# Patient Record
Sex: Male | Born: 1995 | Race: Black or African American | Hispanic: No | Marital: Single | Smoking: Former smoker
Health system: Southern US, Community
[De-identification: ages and names within clinical notes are randomized; demographics above are authoritative.]

## PROBLEM LIST (undated history)

## (undated) HISTORY — PX: OTHER SURGICAL HISTORY: SHX169

---

## 2019-01-03 ENCOUNTER — Other Ambulatory Visit: Payer: Self-pay

## 2019-01-03 ENCOUNTER — Emergency Department (HOSPITAL_COMMUNITY)
Admission: EM | Admit: 2019-01-03 | Discharge: 2019-01-03 | Disposition: A | Discharge status: Home / Self Care | Payer: No Typology Code available for payment source | Attending: Emergency Medicine | Admitting: Emergency Medicine

## 2019-01-03 ENCOUNTER — Emergency Department (HOSPITAL_COMMUNITY): Payer: No Typology Code available for payment source

## 2019-01-03 ENCOUNTER — Encounter (HOSPITAL_COMMUNITY): Payer: Self-pay | Admitting: Emergency Medicine

## 2019-01-03 DIAGNOSIS — F17210 Nicotine dependence, cigarettes, uncomplicated: Secondary | ICD-10-CM | POA: Diagnosis not present

## 2019-01-03 DIAGNOSIS — F1910 Other psychoactive substance abuse, uncomplicated: Secondary | ICD-10-CM | POA: Diagnosis not present

## 2019-01-03 DIAGNOSIS — Y999 Unspecified external cause status: Secondary | ICD-10-CM | POA: Diagnosis not present

## 2019-01-03 DIAGNOSIS — S0181XA Laceration without foreign body of other part of head, initial encounter: Secondary | ICD-10-CM | POA: Insufficient documentation

## 2019-01-03 DIAGNOSIS — W1800XA Striking against unspecified object with subsequent fall, initial encounter: Secondary | ICD-10-CM | POA: Insufficient documentation

## 2019-01-03 DIAGNOSIS — Y9389 Activity, other specified: Secondary | ICD-10-CM | POA: Diagnosis not present

## 2019-01-03 DIAGNOSIS — M542 Cervicalgia: Secondary | ICD-10-CM | POA: Diagnosis not present

## 2019-01-03 DIAGNOSIS — Y929 Unspecified place or not applicable: Secondary | ICD-10-CM | POA: Diagnosis not present

## 2019-01-03 DIAGNOSIS — S0990XA Unspecified injury of head, initial encounter: Secondary | ICD-10-CM

## 2019-01-03 DIAGNOSIS — R55 Syncope and collapse: Secondary | ICD-10-CM | POA: Diagnosis present

## 2019-01-03 LAB — BASIC METABOLIC PANEL
Anion gap: 11 (ref 5–15)
BUN: 16 mg/dL (ref 6–20)
CO2: 24 mmol/L (ref 22–32)
Calcium: 9.9 mg/dL (ref 8.9–10.3)
Chloride: 103 mmol/L (ref 98–111)
Creatinine, Ser: 1.32 mg/dL — ABNORMAL HIGH (ref 0.61–1.24)
GFR calc Af Amer: >60 mL/min (ref >60)
GFR calc non Af Amer: >60 mL/min (ref >60)
Glucose, Bld: 117 mg/dL — ABNORMAL HIGH (ref 70–99)
Potassium: 3.7 mmol/L (ref 3.5–5.1)
Sodium: 138 mmol/L (ref 135–145)

## 2019-01-03 LAB — CBC
HCT: 45.3 % (ref 39.0–52.0)
Hemoglobin: 15.1 g/dL (ref 13.0–17.0)
MCH: 27 pg (ref 26.0–34.0)
MCHC: 33.3 g/dL (ref 30.0–36.0)
MCV: 80.9 fL (ref 80.0–100.0)
Platelets: 140 10*3/uL — ABNORMAL LOW (ref 150–400)
RBC: 5.6 MIL/uL (ref 4.22–5.81)
RDW: 11.9 % (ref 11.5–15.5)
WBC: 6.7 10*3/uL (ref 4.0–10.5)
nRBC: 0 % (ref 0.0–0.2)

## 2019-01-03 LAB — CBG MONITORING, ED: Glucose-Capillary: 110 mg/dL — ABNORMAL HIGH (ref 70–99)

## 2019-01-03 MED ORDER — LORAZEPAM 2 MG/ML IJ SOLN
1.0000 mg | Freq: Once | INTRAMUSCULAR | Status: DC
  Filled 2019-01-03: qty 1

## 2019-01-03 MED ORDER — LACTATED RINGERS IV BOLUS
1000.0000 mL | Freq: Once | INTRAVENOUS | Status: AC
  Administered 2019-01-03: 1000 mL via INTRAVENOUS

## 2019-01-03 NOTE — ED Notes (Signed)
Patient transported to CT 

## 2019-01-03 NOTE — ED Provider Notes (Signed)
[facility] EMERGENCY DEPARTMENT Provider Note   CSN: [id number] Arrival date & time: 01/03/19  0055     History   Chief Complaint Chief Complaint  Patient presents with  . Loss of Consciousness    HPI Randall Lee is a 23 y.o. male.     Patient presents to the emergency department with a chief complaint of head injury.  He states that he was using shrooms, alcohol, and marijuana tonight.  States that he passed out and hit his head.  He complains of mild neck pain.  Denies any numbness, weakness, or tingling.  Denies any slurred speech or vision changes.  Denies any seizures.  Denies any treatments prior to arrival.  Last tetanus shot was within 5 years.    The history is provided by the patient. No language interpreter was used.    History reviewed. No pertinent past medical history.  There are no active problems to display for this patient.   History reviewed. No pertinent surgical history.      Home Medications    Prior to Admission medications   Not on File    Family History History reviewed. No pertinent family history.  Social History Social History   Tobacco Use  . Smoking status: Current Every Day Smoker  . Smokeless tobacco: Never Used  Substance Use Topics  . Alcohol use: Yes  . Drug use: Yes    Types: Marijuana     Allergies   Patient has no allergy information on record.   Review of Systems Review of Systems  All other systems reviewed and are negative.    Physical Exam Updated Vital Signs BP 140/74   Pulse 61   Temp 99.5 F (37.5 C) (Oral)   Resp 17   Ht 6\' 2"  (1.88 m)   Wt 77.1 kg   SpO2 98%   BMI 21.83 kg/m   Physical Exam Vitals signs and nursing note reviewed.  Constitutional:      Appearance: He is well-developed.  HENT:     Head: Normocephalic.     Comments: 2 cm laceration to right eyebrow Eyes:     Conjunctiva/sclera: Conjunctivae normal.  Neck:     Musculoskeletal: Neck supple. No  muscular tenderness.  Cardiovascular:     Rate and Rhythm: Normal rate and regular rhythm.     Heart sounds: No murmur.  Pulmonary:     Effort: Pulmonary effort is normal. No respiratory distress.     Breath sounds: Normal breath sounds.  Abdominal:     Palpations: Abdomen is soft.     Tenderness: There is no abdominal tenderness.  Musculoskeletal: Normal range of motion.  Skin:    General: Skin is warm and dry.  Neurological:     Mental Status: He is alert.  Psychiatric:        Mood and Affect: Mood normal.        Behavior: Behavior normal.      ED Treatments / Results  Labs (all labs ordered are listed, but only abnormal results are displayed) Labs Reviewed  BASIC METABOLIC PANEL - Abnormal; Notable for the following components:      Result Value   Glucose, Bld 117 (*)    Creatinine, Ser 1.32 (*)    All other components within normal limits  CBC - Abnormal; Notable for the following components:   Platelets 140 (*)    All other components within normal limits  CBG MONITORING, ED - Abnormal; Notable for the following components:  Glucose-Capillary 110 (*)    All other components within normal limits    EKG EKG Interpretation  Date/Time:  Monday January 03 2019 00:57:00 EST Ventricular Rate:  69 PR Interval:    QRS Duration: 94 QT Interval:  360 QTC Calculation: 386 R Axis:   83 Text Interpretation: Sinus rhythm LVH by voltage ST elev, probable normal early repol pattern No old tracing to compare Confirmed by Merrily Pew [id number]) on 01/03/2019 12:58:06 AM   Radiology Ct Head Wo Contrast  Result Date: 01/03/2019 CLINICAL DATA:  Near syncopal episode EXAM: CT HEAD WITHOUT CONTRAST TECHNIQUE: Contiguous axial images were obtained from the base of the skull through the vertex without intravenous contrast. COMPARISON:  None. FINDINGS: Brain: No evidence of acute territorial infarction, hemorrhage, hydrocephalus,extra-axial collection or mass lesion/mass effect.  Normal gray-white differentiation. Ventricles are normal in size and contour. Vascular: No hyperdense vessel or unexpected calcification. Skull: The skull is intact. No fracture or focal lesion identified. Sinuses/Orbits: The visualized paranasal sinuses and mastoid air cells are clear. The orbits and globes intact. Other: Soft tissue swelling seen over the right forehead. Cervical spine: Alignment: Physiologic Skull base and vertebrae: Visualized skull base is intact. No atlanto-occipital dissociation. The vertebral body heights are well maintained. No fracture or pathologic osseous lesion seen. Soft tissues and spinal canal: The visualized paraspinal soft tissues are unremarkable. No prevertebral soft tissue swelling is seen. The spinal canal is grossly unremarkable, no large epidural collection or significant canal narrowing. Disc levels:  No significant canal or neural foraminal narrowing. Upper chest: The lung apices are clear. Thoracic inlet is within normal limits. Other: None IMPRESSION: No acute intracranial abnormality. Soft tissue swelling seen over the right forehead. No acute fracture or malalignment of the spine. Electronically Signed   By: Prudencio Pair M.D.   On: 01/03/2019 02:26   Ct Cervical Spine Wo Contrast  Result Date: 01/03/2019 CLINICAL DATA:  Near syncopal episode EXAM: CT HEAD WITHOUT CONTRAST TECHNIQUE: Contiguous axial images were obtained from the base of the skull through the vertex without intravenous contrast. COMPARISON:  None. FINDINGS: Brain: No evidence of acute territorial infarction, hemorrhage, hydrocephalus,extra-axial collection or mass lesion/mass effect. Normal gray-white differentiation. Ventricles are normal in size and contour. Vascular: No hyperdense vessel or unexpected calcification. Skull: The skull is intact. No fracture or focal lesion identified. Sinuses/Orbits: The visualized paranasal sinuses and mastoid air cells are clear. The orbits and globes intact.  Other: Soft tissue swelling seen over the right forehead. Cervical spine: Alignment: Physiologic Skull base and vertebrae: Visualized skull base is intact. No atlanto-occipital dissociation. The vertebral body heights are well maintained. No fracture or pathologic osseous lesion seen. Soft tissues and spinal canal: The visualized paraspinal soft tissues are unremarkable. No prevertebral soft tissue swelling is seen. The spinal canal is grossly unremarkable, no large epidural collection or significant canal narrowing. Disc levels:  No significant canal or neural foraminal narrowing. Upper chest: The lung apices are clear. Thoracic inlet is within normal limits. Other: None IMPRESSION: No acute intracranial abnormality. Soft tissue swelling seen over the right forehead. No acute fracture or malalignment of the spine. Electronically Signed   By: Prudencio Pair M.D.   On: 01/03/2019 02:26    Procedures Procedures (including critical care time) LACERATION REPAIR Performed by: Montine Circle Authorized by: Montine Circle Consent: Verbal consent obtained. Risks and benefits: risks, benefits and alternatives were discussed Consent given by: patient Patient identity confirmed: provided demographic data Prepped and Draped in normal sterile fashion Wound explored  Laceration Location: forehead  Laceration Length: 2cm  No Foreign Bodies seen or palpated  Irrigation method: syringe Amount of cleaning: standard  Skin closure: dermabond  Number of sutures: dermabond  Technique: dermabond  Patient tolerance: Patient tolerated the procedure well with no immediate complications.  Medications Ordered in ED Medications  lactated ringers bolus 1,000 mL (1,000 mLs Intravenous New Bag/Given 01/03/19 0113)     Initial Impression / Assessment and Plan / ED Course  I have reviewed the triage vital signs and the nursing notes.  Pertinent labs & imaging results that were available during my care of the  patient were reviewed by me and considered in my medical decision making (see chart for details).        Patient with reported drug use and alcohol use today.  States that he passed out and hit his head.  He sustained a small laceration.  This was repaired with Dermabond.  CT of his head and neck are negative for any acute intracranial abnormality or C-spine fracture.  He is alert and oriented.  He has no complaints.  Discharged home.  Final Clinical Impressions(s) / ED Diagnoses   Final diagnoses:  Polysubstance abuse [facility]HCC)  Injury of head, initial encounter    ED Discharge Orders    None       Roxy HorsemanBrowning, Adylene Dlugosz, PA-C 01/03/19 0302    Mesner, Barbara CowerJason, MD 01/03/19 [id number]

## 2019-01-03 NOTE — ED Triage Notes (Addendum)
Pt from home BIB [facility] following loss of consciousness lasting 5 minutes. Pt presents to ED A&Ox4, with laceration to right eyebrow after landing head first on the ground.   Pt c/o headache rating 3 out of 10 and neck stiffness. C collar in place  Pt confirms ETOH, TCH, and "shrooms."

## 2019-01-03 NOTE — ED Notes (Signed)
Discharge instructions discussed with pt. Pt verbalized understanding with no questions at this time. Pt to go home with friend 

## 2019-05-12 ENCOUNTER — Ambulatory Visit: Payer: Self-pay | Attending: Internal Medicine

## 2019-05-14 ENCOUNTER — Ambulatory Visit: Payer: No Typology Code available for payment source

## 2019-05-14 ENCOUNTER — Ambulatory Visit: Payer: 59 | Attending: Internal Medicine

## 2019-05-14 DIAGNOSIS — Z23 Encounter for immunization: Secondary | ICD-10-CM

## 2019-05-14 NOTE — Progress Notes (Signed)
   Covid-19 Vaccination Clinic  Name:  Antoinette Haskett    MRN: [id number] DOB: [birth year]  05/14/2019  Mr. Calvario was observed post Covid-19 immunization for 15 minutes without incident. He was provided with Vaccine Information Sheet and instruction to access the V-Safe system.   Mr. Janora Norlander was instructed to call 911 with any severe reactions post vaccine: Marland Kitchen Difficulty breathing  . Swelling of face and throat  . A fast heartbeat  . A bad rash all over body  . Dizziness and weakness   Immunizations Administered    Name Date Dose VIS Date Route   Pfizer COVID-19 Vaccine 05/14/2019  1:48 PM 0.3 mL 01/14/2019 Intramuscular   Manufacturer: [employer], [employer]   Lot: [id number]   NDC: [address]-5

## 2019-06-06 ENCOUNTER — Ambulatory Visit: Payer: 59 | Attending: Internal Medicine

## 2019-06-06 DIAGNOSIS — Z23 Encounter for immunization: Secondary | ICD-10-CM

## 2019-06-06 NOTE — Progress Notes (Signed)
   Covid-19 Vaccination Clinic  Name:  Randall Lee    MRN: [id number] DOB: [birth year]  06/06/2019  Mr. Randall Lee was observed post Covid-19 immunization for 15 minutes without incident. He was provided with Vaccine Information Sheet and instruction to access the V-Safe system.   Mr. Randall Lee was instructed to call 911 with any severe reactions post vaccine: Marland Kitchen Difficulty breathing  . Swelling of face and throat  . A fast heartbeat  . A bad rash all over body  . Dizziness and weakness   Immunizations Administered    Name Date Dose VIS Date Route   Pfizer COVID-19 Vaccine 06/06/2019  1:14 PM 0.3 mL 03/30/2018 Intramuscular   Manufacturer: [employer], [employer]   Lot: [id number]   NDC: [address]-3

## 2020-07-18 IMAGING — CT CT HEAD W/O CM
4 series · 16 of 47 positions shown, 18 images · non-contrast
Comparison: None.

CLINICAL DATA: Near syncopal episode

EXAM:
CT HEAD WITHOUT CONTRAST
TECHNIQUE: Contiguous axial images were obtained from the base of the skull
through the vertex without intravenous contrast.

[Series 3: head bone · axial · 0.47mm/px · z∈[+1373,+1409]mm · 3 of 93 slices shown]
[im 10/93  bone]
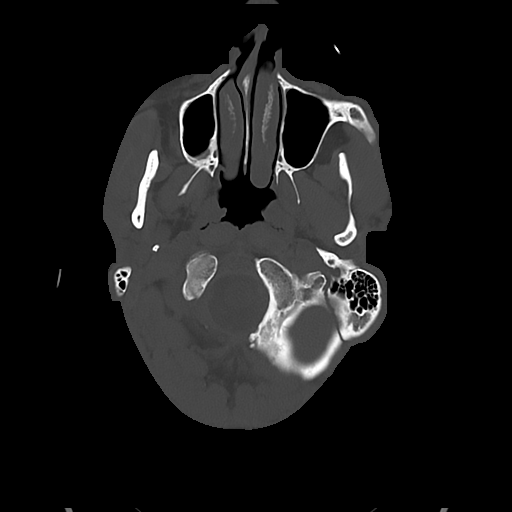
[im 19/93  bone]
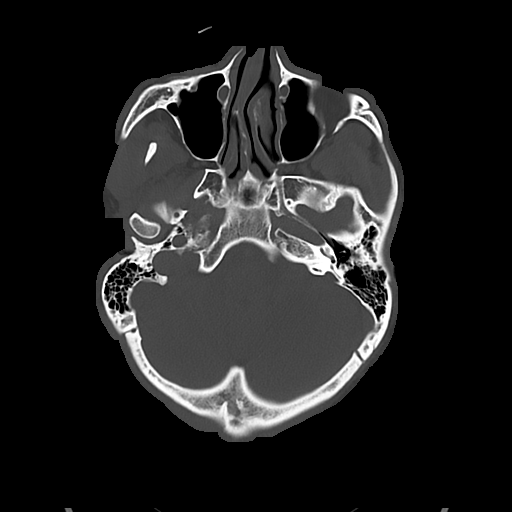
[im 28/93  bone]
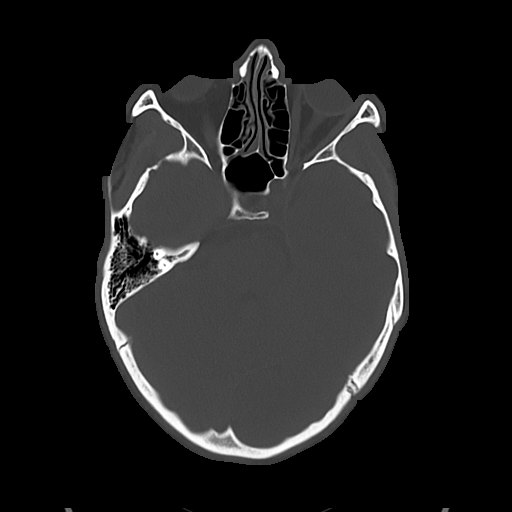

[Series 4: head wo · axial · 0.47mm/px · z∈[+1375,+1510]mm · 7 of 37 slices shown, 9 images]
[im 5/37  brain]
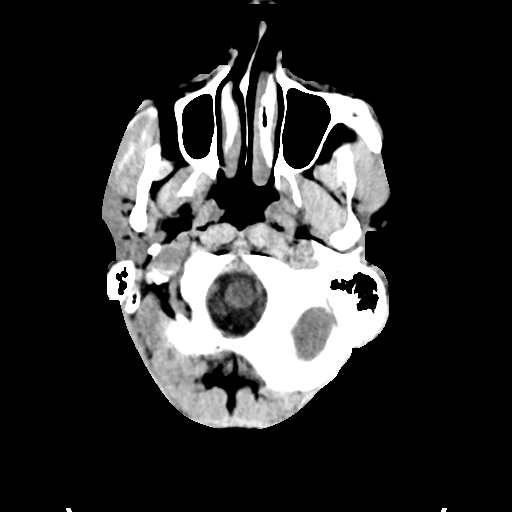
[im 5/37  bone]
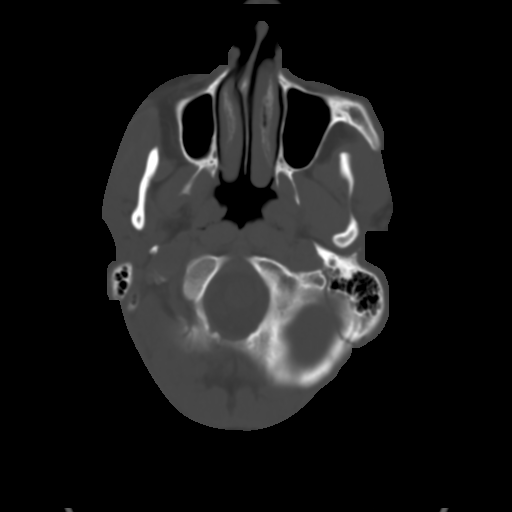
[im 10/37  brain]
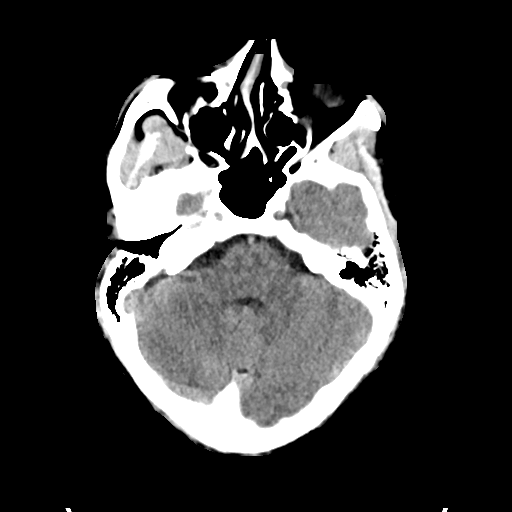
[im 14/37  brain]
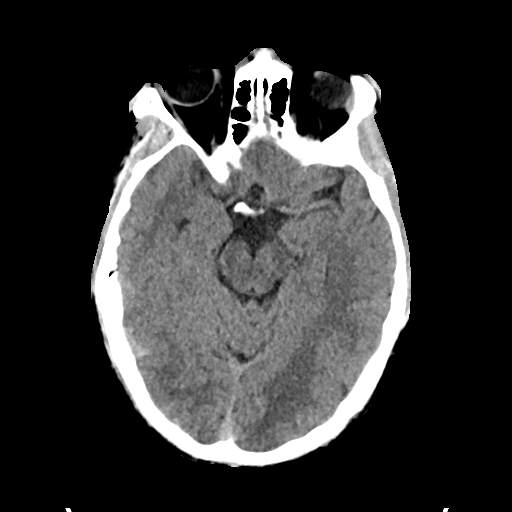
[im 19/37  brain]
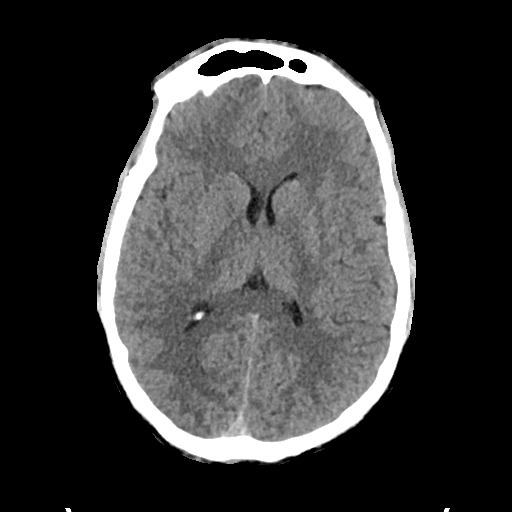
[im 23/37  brain]
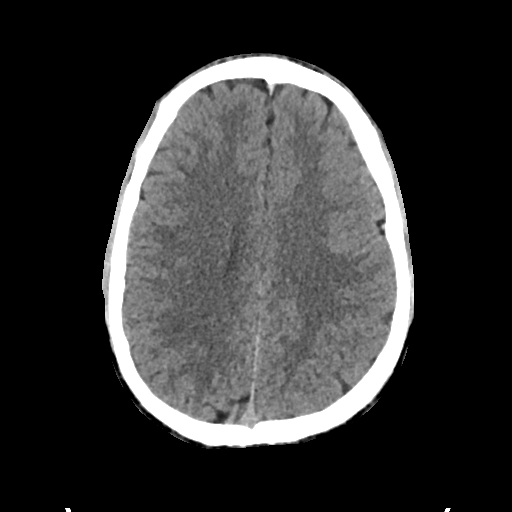
[im 23/37  bone]
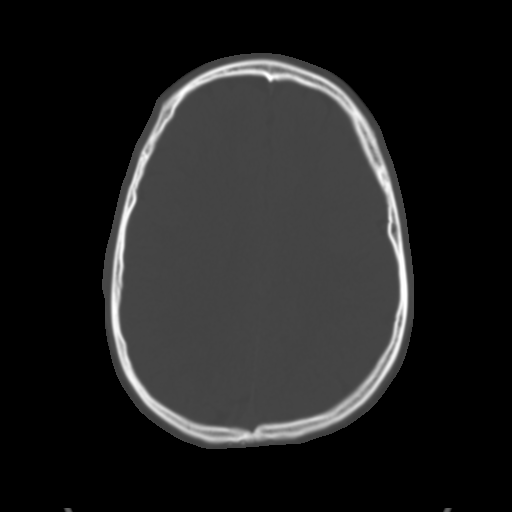
[im 28/37  brain]
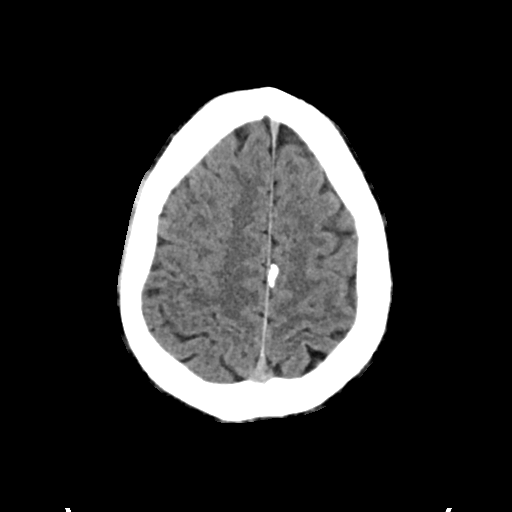
[im 32/37  brain]
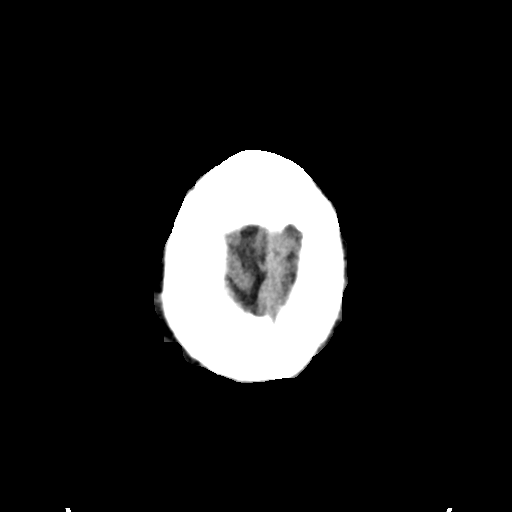

[Series 5: cor soft · coronal · 0.35mm/px · 3 of 75 slices shown]
[im 25/75  brain]
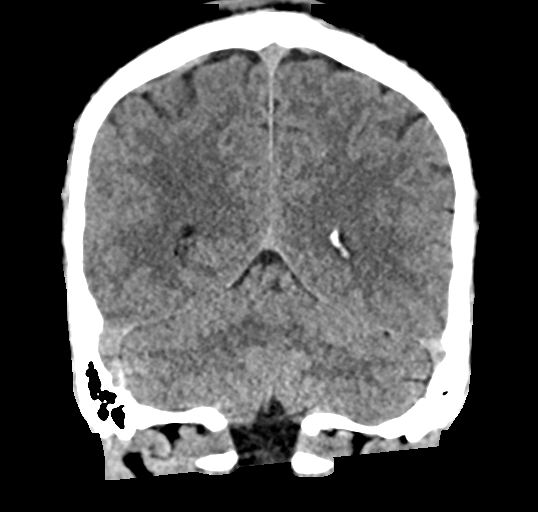
[im 33/75  brain]
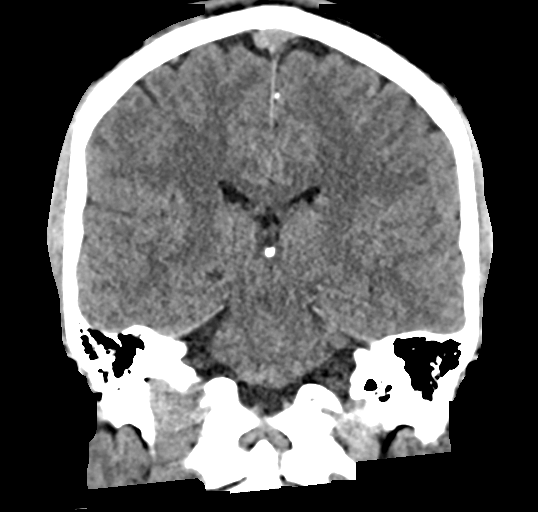
[im 42/75  brain]
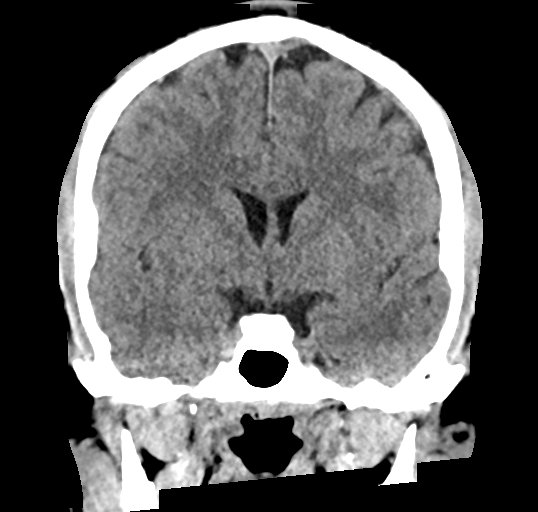

[Series 6: sag soft · sagittal · 0.36mm/px · 3 of 62 slices shown]
[im 21/62  brain]
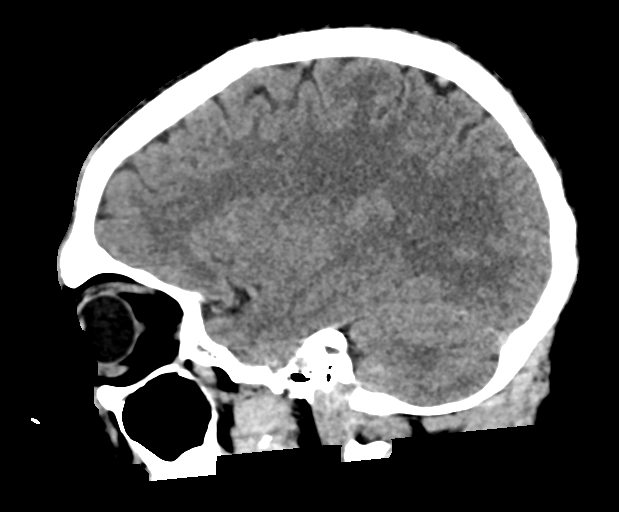
[im 31/62  brain]
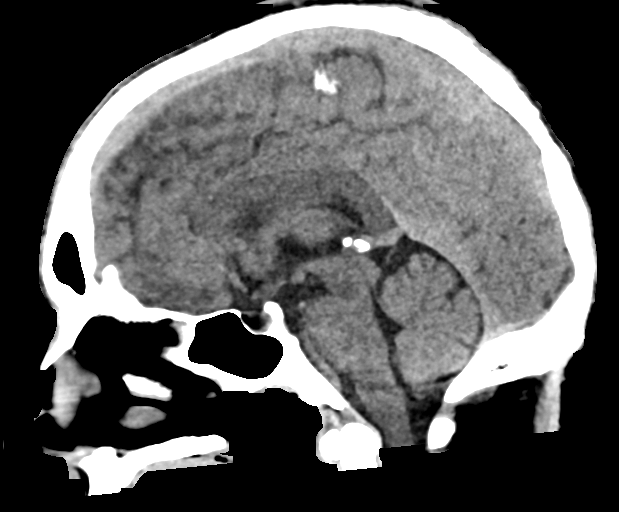
[im 41/62  brain]
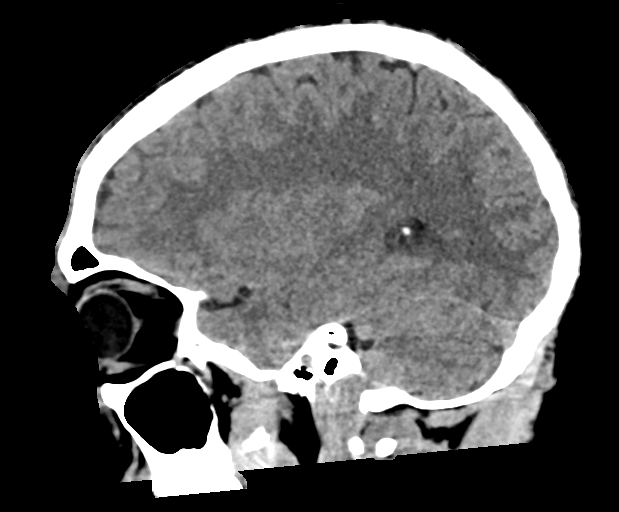

[16 of 47 positions shown; findings below may reference images not displayed]

FINDINGS: Brain: No evidence of acute territorial infarction, hemorrhage,
hydrocephalus,extra-axial collection or mass lesion/mass effect.
Normal gray-white differentiation. Ventricles are normal in size and
contour.

Vascular: No hyperdense vessel or unexpected calcification.

Skull: The skull is intact. No fracture or focal lesion identified.

Sinuses/Orbits: The visualized paranasal sinuses and mastoid air
cells are clear. The orbits and globes intact.

Other: Soft tissue swelling seen over the right forehead.

Cervical spine:

Alignment: Physiologic

Skull base and vertebrae: Visualized skull base is intact. No
atlanto-occipital dissociation. The vertebral body heights are well
maintained. No fracture or pathologic osseous lesion seen.

Soft tissues and spinal canal: The visualized paraspinal soft
tissues are unremarkable. No prevertebral soft tissue swelling is
seen. The spinal canal is grossly unremarkable, no large epidural
collection or significant canal narrowing.

Disc levels:  No significant canal or neural foraminal narrowing.

Upper chest: The lung apices are clear. Thoracic inlet is within
normal limits.

Other: None
IMPRESSION: No acute intracranial abnormality.

Soft tissue swelling seen over the right forehead.

No acute fracture or malalignment of the spine.

## 2023-06-23 ENCOUNTER — Encounter: Payer: Self-pay | Admitting: Cardiology

## 2023-06-25 ENCOUNTER — Ambulatory Visit

## 2023-06-25 ENCOUNTER — Encounter: Payer: Self-pay | Admitting: Cardiology

## 2023-06-25 ENCOUNTER — Ambulatory Visit: Attending: Cardiology | Admitting: Cardiology

## 2023-06-25 VITALS — BP 124/66 | HR 76 | Ht 74.0 in | Wt 173.8 lb

## 2023-06-25 DIAGNOSIS — R011 Cardiac murmur, unspecified: Secondary | ICD-10-CM | POA: Diagnosis present

## 2023-06-25 DIAGNOSIS — R002 Palpitations: Secondary | ICD-10-CM | POA: Diagnosis present

## 2023-06-25 DIAGNOSIS — R001 Bradycardia, unspecified: Secondary | ICD-10-CM | POA: Diagnosis present

## 2023-06-25 DIAGNOSIS — I517 Cardiomegaly: Secondary | ICD-10-CM | POA: Insufficient documentation

## 2023-06-25 DIAGNOSIS — Z7689 Persons encountering health services in other specified circumstances: Secondary | ICD-10-CM | POA: Diagnosis present

## 2023-06-25 NOTE — Progress Notes (Signed)
 Cardiology Consultation:    Date:  06/25/2023   ID:  Moustapha Tooker, DOB 1996/01/09, MRN [id number]  PCP:  Dianah Fort, PA  Cardiologist:  Ralene Burger, MD   Referring MD: Dianah Fort, [place]   Chief Complaint  Patient presents with   heart eval    History of Present Illness:    Noam Karaffa is a 28 y.o. male who is being seen today for the evaluation of heart murmur at the request of Dianah Fort, [place].  Past medical history significant for heart murmur and that he was noted to have when he was a child.  After that he did have some echocardiogram done he was told not anything critical but he need to follow-up from time to time.  He have not seen any cardiologist recently despite he was referred back to me.  He is doing overall very well.  He exercised on the regular basis right back in the matter-of-fact just recently participated in some bike race and he became second.  Denies have any chest pain tightness squeezing pressure burning chest.  Asymptomatic.  No dizziness no passing out.  He described only 1 episode of near syncope that happened years ago when he was trying to push heart rate running after he finished running in the recovery phase he became lightheaded and almost passed out he lay down on the ground he was fine.  He also had abnormal EKG showing sinus bradycardia with a rate of 46.  No dizziness no passing out no syncope.  He does not smoke cigarettes.  He does not have family history of premature coronary artery disease but apparently there is some family history of heart murmur  History reviewed. No pertinent past medical history.  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     Allergies:   Patient has no known allergies.   Social History   Socioeconomic  History   Marital status: Single    Spouse name: Not on file   Number of children: Not on file   Years of education: Not on file   Highest education level: Not on file  Occupational History   Not on file  Tobacco Use   Smoking status: Every Day   Smokeless tobacco: Never  Substance and Sexual Activity   Alcohol use: Yes   Drug use: Yes    Types: Marijuana   Sexual activity: Yes  Other Topics Concern   Not on file  Social History Narrative   ** Merged History Encounter **       Social Drivers of Health   Financial Resource Strain: Not on file  Food Insecurity: Not on file  Transportation Needs: Not on file  Physical Activity: Not on file  Stress: Not on file  Social Connections: Not on file     Family History: The patient's family history includes Hypertension in his mother. ROS:   Please see the history of present illness.    All 14 point review of systems negative except as described per history of present illness.  EKGs/Labs/Other Studies Reviewed:    The following studies were reviewed today:   EKG:  EKG Interpretation Date/Time:  Thursday Jun 25 2023 10:09:20 EDT Ventricular Rate:  46 PR Interval:  160 QRS Duration:  92 QT Interval:  432  QTC Calculation: 378 R Axis:   81  Text Interpretation: Sinus bradycardia with sinus arrhythmia Left ventricular hypertrophy Early repolarization Abnormal ECG When compared with ECG of 03-Jan-2019 00:57, PREVIOUS ECG IS PRESENT Confirmed by Ralene Burger [phone]) on 06/25/2023 10:32:30 AM    Recent Labs: No results found for requested labs within last 365 days.  Recent Lipid Panel No results found for: "CHOL", "TRIG", "HDL", "CHOLHDL", "VLDL", "LDLCALC", "LDLDIRECT"  Physical Exam:    VS:  BP 124/66 (BP Location: Left Arm, Patient Position: Sitting)   Pulse 76   Ht 6\' 2"  (1.88 m)   Wt 173 lb 12.8 oz (78.8 kg)   SpO2 99%   BMI 22.31 kg/m     Wt Readings from Last 3 Encounters:  06/25/23 173 lb 12.8 oz (78.8  kg)  01/03/19 170 lb (77.1 kg)     GEN:  Well nourished, well developed in no acute distress HEENT: Normal NECK: No JVD; No carotid bruits LYMPHATICS: No lymphadenopathy CARDIAC: RRR, late systolic murmur best heard at the left border of the sternum grade 1/6 to 2/6 no rubs, no gallops RESPIRATORY:  Clear to auscultation without rales, wheezing or rhonchi  ABDOMEN: Soft, non-tender, non-distended MUSCULOSKELETAL:  No edema; No deformity  SKIN: Warm and dry NEUROLOGIC:  Alert and oriented x 3 PSYCHIATRIC:  Normal affect   ASSESSMENT:    1. Encounter to establish care   2. Palpitations   3. Heart murmur   4. Sinus bradycardia    PLAN:    In order of problems listed above:  Heart murmur.  Will schedule him to have an echocardiogram done to reassess valves.  I honestly do not anticipate to have anything critical especially in view of the fact that he is very active and he can run or ride a bike with no difficulties. Sinus bradycardia.  Completely asymptomatic will put monitor for 1 week to make sure his sinus node have normal function anticipate fact that his heart rate is slow related to good vagal tone. Left ventricle hypertrophy again will do echocardiogram to look at thickness of the wall could be related to the fact that he is athletic also he is relatively skinny   Medication Adjustments/Labs and Tests Ordered: Current medicines are reviewed at length with the patient today.  Concerns regarding medicines are outlined above.  Orders Placed This Encounter  Procedures   LONG TERM MONITOR (3-14 DAYS)   EKG 12-Lead   ECHOCARDIOGRAM COMPLETE   No orders of the defined types were placed in this encounter.   Signed, Manfred Seed, MD, [place]. 06/25/2023 10:33 AM    [facility]

## 2023-06-25 NOTE — Patient Instructions (Signed)
 Medication Instructions:  Your physician recommends that you continue on your current medications as directed. Please refer to the Current Medication list given to you today.  *If you need a refill on your cardiac medications before your next appointment, please call your pharmacy*   Lab Work: None Ordered If you have labs (blood work) drawn today and your tests are completely normal, you will receive your results only by: MyChart Message (if you have MyChart) OR A paper copy in the mail If you have any lab test that is abnormal or we need to change your treatment, we will call you to review the results.   Testing/Procedures: Your physician has requested that you have an echocardiogram. Echocardiography is a painless test that uses sound waves to create images of your heart. It provides your doctor with information about the size and shape of your heart and how well your heart's chambers and valves are working. This procedure takes approximately one hour. There are no restrictions for this procedure. Please do NOT wear cologne, perfume, aftershave, or lotions (deodorant is allowed). Please arrive 15 minutes prior to your appointment time.  Please note: We ask at that you not bring children with you during ultrasound (echo/ vascular) testing. Due to room size and safety concerns, children are not allowed in the ultrasound rooms during exams. Our front office staff cannot provide observation of children in our lobby area while testing is being conducted. An adult accompanying a patient to their appointment will only be allowed in the ultrasound room at the discretion of the ultrasound technician under special circumstances. We apologize for any inconvenience.    WHY IS MY DOCTOR PRESCRIBING ZIO? The Zio system is proven and trusted by physicians to detect and diagnose irregular heart rhythms -- and has been prescribed to hundreds of thousands of patients.  The FDA has cleared the Zio system to  monitor for many different kinds of irregular heart rhythms. In a study, physicians were able to reach a diagnosis 90% of the time with the Zio system1.  You can wear the Zio monitor -- a small, discreet, comfortable patch -- during your normal day-to-day activity, including while you sleep, shower, and exercise, while it records every single heartbeat for analysis.  1Barrett, P., et al. Comparison of 24 Hour Holter Monitoring Versus 14 Day Novel Adhesive Patch Electrocardiographic Monitoring. American Journal of Medicine, 2014.  ZIO VS. HOLTER MONITORING The Zio monitor can be comfortably worn for up to 14 days. Holter monitors can be worn for 24 to 48 hours, limiting the time to record any irregular heart rhythms you may have. Zio is able to capture data for the 51% of patients who have their first symptom-triggered arrhythmia after 48 hours.1  LIVE WITHOUT RESTRICTIONS The Zio ambulatory cardiac monitor is a small, unobtrusive, and water-resistant patch--you might even forget you're wearing it. The Zio monitor records and stores every beat of your heart, whether you're sleeping, working out, or showering.     Follow-Up: At [facility], you and your health needs are our priority.  As part of our continuing mission to provide you with exceptional heart care, we have created designated Provider Care Teams.  These Care Teams include your primary Cardiologist (physician) and Advanced Practice Providers (APPs -  Physician Assistants and Nurse Practitioners) who all work together to provide you with the care you need, when you need it.  We recommend signing up for the patient portal called "MyChart".  Sign up information is provided on this  After Visit Summary.  MyChart is used to connect with patients for Virtual Visits (Telemedicine).  Patients are able to view lab/test results, encounter notes, upcoming appointments, etc.  Non-urgent messages can be sent to your provider as well.   To learn more  about what you can do with MyChart, go to [email or web address].    Your next appointment:   3 month(s)  The format for your next appointment:   In Person  Provider:   Gypsy Balsam, MD    Other Instructions NA

## 2023-07-08 ENCOUNTER — Telehealth: Payer: Self-pay | Admitting: Cardiology

## 2023-07-08 ENCOUNTER — Other Ambulatory Visit: Payer: Self-pay

## 2023-07-08 DIAGNOSIS — R001 Bradycardia, unspecified: Secondary | ICD-10-CM

## 2023-07-08 NOTE — Telephone Encounter (Signed)
 Called the patient and informed him that his heart rate had dropped into the 30's based on the results of his heart monitor. I had spoken to Dr. Gordan Latina regarding this and he had referred him to see Dr. Lawana Pray in EP. Patient verbalized understanding and had no further questions at this time. A referral was placed via Epic for the patient to have an appointment with Dr. Lawana Pray.

## 2023-07-08 NOTE — Telephone Encounter (Signed)
 Olivia/I-Rhythm abn zio results

## 2023-07-08 NOTE — Telephone Encounter (Signed)
 Called Olivia back at [facility] and spoke to [place]. She reported that the patient had 3 episodes of symptomatic bradycardia:  39 bpm which lasted for 30 seconds and occurred on 5/22 at 11:35 am  2.   35 bpm which lasted for 30 seconds and occurred on 5/24 at 8: 47 pm  3.   32 bpm which lasted for 30 seconds and occurred on 5/26 at 12:29 am  The Zio heart monitor report is in Epic. Please advise.

## 2023-07-20 ENCOUNTER — Ambulatory Visit: Payer: Self-pay | Admitting: Cardiology

## 2023-07-20 DIAGNOSIS — R002 Palpitations: Secondary | ICD-10-CM

## 2023-08-03 ENCOUNTER — Ambulatory Visit (HOSPITAL_COMMUNITY)
Admission: RE | Admit: 2023-08-03 | Discharge: 2023-08-03 | Disposition: A | Discharge status: Home / Self Care | Source: Ambulatory Visit | Attending: Cardiovascular Disease | Admitting: Cardiovascular Disease

## 2023-08-03 DIAGNOSIS — R011 Cardiac murmur, unspecified: Secondary | ICD-10-CM | POA: Diagnosis present

## 2023-08-03 LAB — ECHOCARDIOGRAM COMPLETE
Area-P 1/2: 2.79 cm2
S' Lateral: 3 cm

## 2023-09-04 ENCOUNTER — Telehealth: Payer: Self-pay | Admitting: Cardiology

## 2023-09-04 NOTE — Telephone Encounter (Signed)
Pt returning nurses call regarding results. Please advise 

## 2023-09-04 NOTE — Telephone Encounter (Signed)
Patient informed of the results of his echo. ?

## 2023-09-06 ENCOUNTER — Emergency Department (HOSPITAL_COMMUNITY)

## 2023-09-06 ENCOUNTER — Emergency Department (HOSPITAL_COMMUNITY)
Admission: EM | Admit: 2023-09-06 | Discharge: 2023-09-06 | Disposition: A | Discharge status: Home / Self Care | Attending: Student in an Organized Health Care Education/Training Program | Admitting: Student in an Organized Health Care Education/Training Program

## 2023-09-06 ENCOUNTER — Encounter (HOSPITAL_COMMUNITY): Payer: Self-pay

## 2023-09-06 ENCOUNTER — Other Ambulatory Visit: Payer: Self-pay

## 2023-09-06 DIAGNOSIS — S80211A Abrasion, right knee, initial encounter: Secondary | ICD-10-CM | POA: Insufficient documentation

## 2023-09-06 DIAGNOSIS — S0993XA Unspecified injury of face, initial encounter: Secondary | ICD-10-CM

## 2023-09-06 DIAGNOSIS — S025XXA Fracture of tooth (traumatic), initial encounter for closed fracture: Secondary | ICD-10-CM | POA: Insufficient documentation

## 2023-09-06 DIAGNOSIS — S0181XA Laceration without foreign body of other part of head, initial encounter: Secondary | ICD-10-CM | POA: Insufficient documentation

## 2023-09-06 DIAGNOSIS — Y92410 Unspecified street and highway as the place of occurrence of the external cause: Secondary | ICD-10-CM | POA: Diagnosis not present

## 2023-09-06 DIAGNOSIS — S20319A Abrasion of unspecified front wall of thorax, initial encounter: Secondary | ICD-10-CM | POA: Insufficient documentation

## 2023-09-06 DIAGNOSIS — S301XXA Contusion of abdominal wall, initial encounter: Secondary | ICD-10-CM | POA: Diagnosis not present

## 2023-09-06 LAB — I-STAT CHEM 8, ED
BUN: 13 mg/dL (ref 6–20)
Calcium, Ion: 1.25 mmol/L (ref 1.15–1.40)
Chloride: 101 mmol/L (ref 98–111)
Creatinine, Ser: 1.3 mg/dL — ABNORMAL HIGH (ref 0.61–1.24)
Glucose, Bld: 96 mg/dL (ref 70–99)
HCT: 42 % (ref 39.0–52.0)
Hemoglobin: 14.3 g/dL (ref 13.0–17.0)
Potassium: 4.3 mmol/L (ref 3.5–5.1)
Sodium: 140 mmol/L (ref 135–145)
TCO2: 26 mmol/L (ref 22–32)

## 2023-09-06 LAB — CBC
HCT: 42.8 % (ref 39.0–52.0)
Hemoglobin: 13.3 g/dL (ref 13.0–17.0)
MCH: 26.5 pg (ref 26.0–34.0)
MCHC: 31.1 g/dL (ref 30.0–36.0)
MCV: 85.4 fL (ref 80.0–100.0)
Platelets: 119 K/uL — ABNORMAL LOW (ref 150–400)
RBC: 5.01 MIL/uL (ref 4.22–5.81)
RDW: 12.3 % (ref 11.5–15.5)
WBC: 6.6 K/uL (ref 4.0–10.5)
nRBC: 0 % (ref 0.0–0.2)

## 2023-09-06 LAB — COMPREHENSIVE METABOLIC PANEL WITH GFR
ALT: 21 U/L (ref 0–44)
AST: 28 U/L (ref 15–41)
Albumin: 4.3 g/dL (ref 3.5–5.0)
Alkaline Phosphatase: 44 U/L (ref 38–126)
Anion gap: 6 (ref 5–15)
BUN: 11 mg/dL (ref 6–20)
CO2: 27 mmol/L (ref 22–32)
Calcium: 9.7 mg/dL (ref 8.9–10.3)
Chloride: 104 mmol/L (ref 98–111)
Creatinine, Ser: 1.19 mg/dL (ref 0.61–1.24)
GFR, Estimated: >60 mL/min (ref >60)
Glucose, Bld: 98 mg/dL (ref 70–99)
Potassium: 4.2 mmol/L (ref 3.5–5.1)
Sodium: 137 mmol/L (ref 135–145)
Total Bilirubin: 1.1 mg/dL (ref 0.0–1.2)
Total Protein: 7 g/dL (ref 6.5–8.1)

## 2023-09-06 MED ORDER — LIDOCAINE HCL (PF) 1 % IJ SOLN
30.0000 mL | Freq: Once | INTRAMUSCULAR | Status: AC
  Administered 2023-09-06: 30 mL
  Filled 2023-09-06: qty 30

## 2023-09-06 MED ORDER — IOHEXOL 350 MG/ML SOLN
75.0000 mL | Freq: Once | INTRAVENOUS | Status: AC | PRN
  Administered 2023-09-06: 75 mL via INTRAVENOUS

## 2023-09-06 MED ORDER — SULFAMETHOXAZOLE-TRIMETHOPRIM 800-160 MG PO TABS: 1.0000 | ORAL_TABLET | Freq: Two times a day (BID) | ORAL | 0 refills | Status: DC

## 2023-09-06 NOTE — ED Notes (Signed)
 Ed provider awaiting for a dental medication coming from one of our med center ED. Parents getting anxious and upset about the long wait. Parent oriented CN is working on getting the medication available and as soon provider has it he will be on the Room to take care of the pt. Parents continues arguing with ED staff requesting to talk to CN because they are ready to go. CN oriented parents about waiting for meds to come from another location.

## 2023-09-06 NOTE — Discharge Instructions (Signed)
 Please see your primary care provider within the next 7 days to have your sutures removed.  If you do have any concerns regarding your wound healing please see us  here in the emergency department or follow-up with your primary care provider.  Strongly encourage you to follow-up with a dentist regarding the fractures to your tooth, the temporary dental cement that was placed on your tooth will not be permanent, and does need to be replaced with a permanent filling.
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
## 2023-09-13 ENCOUNTER — Ambulatory Visit (HOSPITAL_COMMUNITY)
Admission: RE | Admit: 2023-09-13 | Discharge: 2023-09-13 | Disposition: A | Discharge status: Home / Self Care | Payer: Self-pay | Source: Ambulatory Visit | Attending: Nurse Practitioner | Admitting: Nurse Practitioner

## 2023-09-13 ENCOUNTER — Encounter (HOSPITAL_COMMUNITY): Payer: Self-pay

## 2023-09-13 ENCOUNTER — Ambulatory Visit (HOSPITAL_COMMUNITY): Payer: Self-pay

## 2023-09-13 VITALS — BP 112/64 | HR 48 | Temp 98.8°F | Resp 14

## 2023-09-13 DIAGNOSIS — Z4802 Encounter for removal of sutures: Secondary | ICD-10-CM | POA: Diagnosis not present

## 2023-09-13 DIAGNOSIS — Z5189 Encounter for other specified aftercare: Secondary | ICD-10-CM | POA: Diagnosis not present

## 2023-09-13 NOTE — ED Triage Notes (Signed)
 Patient reports that he received sutures to his left cheek and left eye brow. Patient received these in the [facility] ED. No redness, edema, or drainage.
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
## 2023-09-28 ENCOUNTER — Ambulatory Visit: Attending: Cardiology | Admitting: Cardiology

## 2023-09-28 ENCOUNTER — Encounter: Payer: Self-pay | Admitting: Cardiology

## 2023-09-28 VITALS — BP 128/60 | HR 64 | Resp 18 | Ht 74.0 in | Wt 173.0 lb

## 2023-09-28 DIAGNOSIS — I517 Cardiomegaly: Secondary | ICD-10-CM | POA: Diagnosis present

## 2023-09-28 DIAGNOSIS — R001 Bradycardia, unspecified: Secondary | ICD-10-CM | POA: Insufficient documentation

## 2023-09-28 DIAGNOSIS — R011 Cardiac murmur, unspecified: Secondary | ICD-10-CM | POA: Insufficient documentation

## 2023-09-28 DIAGNOSIS — I341 Nonrheumatic mitral (valve) prolapse: Secondary | ICD-10-CM | POA: Insufficient documentation

## 2023-09-28 NOTE — Patient Instructions (Signed)
 Medication Instructions:  Your physician recommends that you continue on your current medications as directed. Please refer to the Current Medication list given to you today.  *If you need a refill on your cardiac medications before your next appointment, please call your pharmacy*   Lab Work: None Ordered If you have labs (blood work) drawn today and your tests are completely normal, you will receive your results only by: MyChart Message (if you have MyChart) OR A paper copy in the mail If you have any lab test that is abnormal or we need to change your treatment, we will call you to review the results.   Testing/Procedures: Your physician has requested that you have an echocardiogram. Echocardiography is a painless test that uses sound waves to create images of your heart. It provides your doctor with information about the size and shape of your heart and how well your heart's chambers and valves are working. This procedure takes approximately one hour. There are no restrictions for this procedure. Please do NOT wear cologne, perfume, aftershave, or lotions (deodorant is allowed). Please arrive 15 minutes prior to your appointment time.  Please note: We ask at that you not bring children with you during ultrasound (echo/ vascular) testing. Due to room size and safety concerns, children are not allowed in the ultrasound rooms during exams. Our front office staff cannot provide observation of children in our lobby area while testing is being conducted. An adult accompanying a patient to their appointment will only be allowed in the ultrasound room at the discretion of the ultrasound technician under special circumstances. We apologize for any inconvenience.    Follow-Up: At [facility], you and your health needs are our priority.  As part of our continuing mission to provide you with exceptional heart care, we have created designated Provider Care Teams.  These Care Teams include your  primary Cardiologist (physician) and Advanced Practice Providers (APPs -  Physician Assistants and Nurse Practitioners) who all work together to provide you with the care you need, when you need it.  We recommend signing up for the patient portal called MyChart.  Sign up information is provided on this After Visit Summary.  MyChart is used to connect with patients for Virtual Visits (Telemedicine).  Patients are able to view lab/test results, encounter notes, upcoming appointments, etc.  Non-urgent messages can be sent to your provider as well.   To learn more about what you can do with MyChart, go to [email or web address].    Your next appointment:   12 month(s)  The format for your next appointment:   In Person  Provider:   Lamar Fitch, MD    Other Instructions NA

## 2023-09-28 NOTE — Addendum Note (Signed)
 Addended by: ARLOA PLANAS D on: 09/28/2023 10:22 AM   Modules accepted: Orders

## 2023-09-28 NOTE — Progress Notes (Signed)
 Cardiology Office Note:    Date:  09/28/2023   ID:  Randall Lee, DOB Dec 04, 1995, MRN [id number]  PCP:  Rosalea Rosina SAILOR, PA  Cardiologist:  Randall Fitch, MD    Referring MD: Rosalea Rosina SAILOR, [place]   Chief Complaint  Patient presents with   Follow-up    History of Present Illness:    Randall Lee is a 28 y.o. male who was referred to us  because of heart murmur.  Apparently years ago he was told to have a heart murmur but never had kind of established diagnosis in the records and with slow heart rate.  Overall he is doing great he still exercise on the regular basis doing quite well no chest pain tightness squeezing pressure in chest no dizziness no passing out.  Echocardiogram showed mitral valve prolapse moderate, however likely only mild mitral regurgitation.  Was no evidence of LVH on echocardiogram and there is there was concern his EKG show LVH, he also wore a monitor monitor showed average heart rate 46.  However maximal heart rate was 168 so good chronotropic response he probably does have some basic baseline sinus bradycardia secondary to him being very athletic and he also tells me this is a familial issue, his mother apparently had also slow heart rate and never had any intervention required.  No past medical history on file.  Past Surgical History:  Procedure Laterality Date   Calcium deposit removed from Left index      Current Medications: No outpatient medications have been marked as taking for the 09/28/23 encounter (Office Visit) with Chevy Sweigert J, MD.     Allergies:   Patient has no known allergies.   Social History   Socioeconomic History   Marital status: Single    Spouse name: Not on file   Number of children: Not on file   Years of education: Not on file   Highest education level: Not on file  Occupational History   Not on file  Tobacco Use   Smoking status: Former    Types: Cigarettes   Smokeless tobacco: Never  Vaping Use   Vaping  status: Never Used  Substance and Sexual Activity   Alcohol use: Yes   Drug use: Yes    Types: Marijuana   Sexual activity: Yes  Other Topics Concern   Not on file  Social History Narrative   ** Merged History Encounter **       Social Drivers of Corporate investment banker Strain: Not on file  Food Insecurity: Not on file  Transportation Needs: Not on file  Physical Activity: Not on file  Stress: Not on file  Social Connections: Not on file     Family History: The patient's family history includes Hypertension in his mother. ROS:   Please see the history of present illness.    All 14 point review of systems negative except as described per history of present illness  EKGs/Labs/Other Studies Reviewed:         Recent Labs: 09/06/2023: ALT 21; BUN 13; Creatinine, Ser 1.30; Hemoglobin 14.3; Platelets 119; Potassium 4.3; Sodium 140  Recent Lipid Panel No results found for: CHOL, TRIG, HDL, CHOLHDL, VLDL, LDLCALC, LDLDIRECT  Physical Exam:    VS:  BP 128/60 (BP Location: Left Arm, Patient Position: Sitting, Cuff Size: Normal)   Pulse 64   Resp 18   Ht 6' 2 (1.88 m)   Wt 173 lb (78.5 kg)   SpO2 100%   BMI 22.21 kg/m  Wt Readings from Last 3 Encounters:  09/28/23 173 lb (78.5 kg)  09/06/23 175 lb (79.4 kg)  06/25/23 173 lb 12.8 oz (78.8 kg)     GEN:  Well nourished, well developed in no acute distress HEENT: Normal NECK: No JVD; No carotid bruits LYMPHATICS: No lymphadenopathy CARDIAC: RRR, n systolic murmur grade 2/6 pressure left border of sternum, no rubs, no gallops RESPIRATORY:  Clear to auscultation without rales, wheezing or rhonchi  ABDOMEN: Soft, non-tender, non-distended MUSCULOSKELETAL:  No edema; No deformity  SKIN: Warm and dry LOWER EXTREMITIES: no swelling NEUROLOGIC:  Alert and oriented x 3 PSYCHIATRIC:  Normal affect   ASSESSMENT:    1. Mitral valve prolapse   2. Sinus bradycardia   3. LVH (left ventricular hypertrophy)    4. Heart murmur    PLAN:    In order of problems listed above:  Mitral valve prolapse, moderate, only trivial to mild mitral regurgitation continue monitoring, next year we will do another echocardiogram to determine if this is something else progressing.  I warned her about potential signs and symptoms of worsening of it like rupture chordae which will create shortness of breath and ability to exercise as he normally does and I ask him to let me know if he develop that. Sinus bradycardia rate today 44 however he is completely asymptomatic exercise on the regular basis chronotropic response is good based on monitor analysis showing heart rate 168 we will continue monitoring we will obviously avoid sinus blocking agent. Heart murmur related to mitral valve prolapse and mitral regurgitation. Left ventricle hypertrophy on EKG not confirmed on the echocardiogram.   Medication Adjustments/Labs and Tests Ordered: Current medicines are reviewed at length with the patient today.  Concerns regarding medicines are outlined above.  No orders of the defined types were placed in this encounter.  Medication changes: No orders of the defined types were placed in this encounter.   Signed, Randall DOROTHA Fitch, MD, [facility] 09/28/2023 10:13 AM    [facility]
